# Patient Record
Sex: Male | Born: 1995 | Race: White | Hispanic: No | Marital: Single | Smoking: Current every day smoker
Health system: Southern US, Community
[De-identification: ages and names within clinical notes are randomized; demographics above are authoritative.]

## PROBLEM LIST (undated history)

## (undated) DIAGNOSIS — N319 Neuromuscular dysfunction of bladder, unspecified: Secondary | ICD-10-CM

## (undated) HISTORY — PX: MYRINGOTOMY WITH TUBE PLACEMENT: SHX5663

---
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
## 2018-05-26 ENCOUNTER — Other Ambulatory Visit: Payer: Self-pay

## 2018-05-26 ENCOUNTER — Encounter: Payer: Self-pay | Admitting: Emergency Medicine

## 2018-05-26 ENCOUNTER — Emergency Department
Admission: EM | Admit: 2018-05-26 | Discharge: 2018-05-26 | Disposition: A | Discharge status: Home / Self Care | Payer: Self-pay | Attending: Emergency Medicine | Admitting: Emergency Medicine

## 2018-05-26 DIAGNOSIS — F1210 Cannabis abuse, uncomplicated: Secondary | ICD-10-CM | POA: Insufficient documentation

## 2018-05-26 DIAGNOSIS — G4700 Insomnia, unspecified: Secondary | ICD-10-CM | POA: Insufficient documentation

## 2018-05-26 DIAGNOSIS — F17200 Nicotine dependence, unspecified, uncomplicated: Secondary | ICD-10-CM | POA: Insufficient documentation

## 2018-05-26 MED ORDER — ZOLPIDEM TARTRATE 10 MG PO TABS: 10.0000 mg | ORAL_TABLET | Freq: Every evening | ORAL | 0 refills | Status: DC | PRN

## 2018-05-26 NOTE — ED Triage Notes (Addendum)
Pt in with co inability to sleep, states in the last 48 hrs has only slept 3-4 hrs. States usually takes melatonin but not helping at this time. Pt denies any other complaints or concerns, states does need a work note.

## 2018-05-26 NOTE — ED Notes (Signed)
Pt reports insomnia with 3-4 hrs of sleep in the last 48 hrs. Reports having sleepless nights in the past but "never this bad". Pt denies significant stressors, endorses depression diagnosis, endorses marijuana use only.

## 2018-05-26 NOTE — ED Provider Notes (Signed)
[facility] Emergency Department Provider Note  ____________________________________________   First MD Initiated Contact with Patient 05/26/18 2116     (approximate)  I have reviewed the triage vital signs and the nursing notes.   HISTORY  Chief Complaint Insomnia    HPI Eric Flynn is a 22 y.o. male with no chronic medical history who presents for evaluation of insomnia.  He says that he has not been able to sleep at all for the last 2 or 3 days.  He has tried melatonin and Tylenol PM and nothing is worked.  He denies any recent changes in his schedule, no increase in caffeine usage, no other drugs other than occasional marijuana use, no new medications.  He has been under stress recently and found out that his sister killed herself, but he had this information several weeks ago and the insomnia only started within the last couple of days.  He is adamant that he does not use any stimulants because he says he grew up in a house with multiple people who use crack and he saw that he can do to you.  It is affecting his work and he is hoping that there is something that I can do to help with his insomnia.  He describes it is relatively acute in onset and severe.  Nothing helps nothing makes it worse.  He says he has suffered from depression in the past but he does not currently feel depressed and he has not had any thoughts of hurting himself or anyone else.  History reviewed. No pertinent past medical history.  There are no active problems to display for this patient.   History reviewed. No pertinent surgical history.  Prior to Admission medications   Medication Sig Start Date End Date Taking? Authorizing Provider  zolpidem (AMBIEN) 10 MG tablet Take 1 tablet (10 mg total) by mouth at bedtime as needed for sleep. 05/26/18 06/25/18  Loleta RoseForbach, Kaliana Albino, MD    Allergies Penicillins  History reviewed. No pertinent family history.  Social History Social History    Tobacco Use  . Smoking status: Current Every Day Smoker  . Smokeless tobacco: Never Used  Substance Use Topics  . Alcohol use: Not on file  . Drug use: Yes    Types: Marijuana    Comment: occasional    Review of Systems Constitutional: No fever/chills Cardiovascular: Denies chest pain. Respiratory: Denies shortness of breath. Gastrointestinal: No abdominal pain.  No nausea, no vomiting.  No diarrhea.  No constipation. Genitourinary: Negative for dysuria. Musculoskeletal: Negative for neck pain.  Negative for back pain. Integumentary: Negative for rash. Neurological: Negative for headaches, focal weakness or numbness. Psychiatric:Insomnia, no depression or suicidal ideation  ____________________________________________   PHYSICAL EXAM:  VITAL SIGNS: ED Triage Vitals  Enc Vitals Group     BP 05/26/18 1935 129/82     Pulse Rate 05/26/18 1935 (!) 112     Resp 05/26/18 1935 20     Temp 05/26/18 1935 98.2 F (36.8 C)     Temp Source 05/26/18 1935 Oral     SpO2 05/26/18 1935 100 %     Weight 05/26/18 1936 90.7 kg (200 lb)     Height 05/26/18 1936 1.803 m (5\' 11" )     Head Circumference --      Peak Flow --      Pain Score 05/26/18 2053 0     Pain Loc --      Pain Edu? --      Excl.  in GC? --     Constitutional: Alert and oriented. Well appearing and in no acute distress. Eyes: Conjunctivae are mildly injected bilaterally and the patient does look tired. Head: Atraumatic. Nose: No congestion/rhinnorhea. Neck: No stridor.  No meningeal signs.   Cardiovascular: Normal rate, regular rhythm. Good peripheral circulation. Grossly normal heart sounds. Respiratory: Normal respiratory effort.  No retractions. Lungs CTAB. Gastrointestinal: Soft and nontender. No distention.  Musculoskeletal: No lower extremity tenderness nor edema. No gross deformities of extremities. Neurologic:  Normal speech and language. No gross focal neurologic deficits are appreciated.  Skin:  Skin  is warm, dry and intact. No rash noted. Psychiatric: Mood and affect are normal. Speech and behavior are normal.  No flight of ideas, no manic or sub-manic behavior.  Insight and judgment seem appropriate.  No suicidal ideation or homicidal ideation.  ____________________________________________   LABS (all labs ordered are listed, but only abnormal results are displayed)  Labs Reviewed - No data to display ____________________________________________  EKG  None - EKG not ordered by ED physician ____________________________________________  RADIOLOGY  ED MD interpretation: No indication for imaging  Official radiology report(s): No results found.  ____________________________________________   PROCEDURES  Critical Care performed: No   Procedure(s) performed:   Procedures   ____________________________________________   INITIAL IMPRESSION / ASSESSMENT AND PLAN / ED COURSE  As part of my medical decision making, I reviewed the following data within the electronic MEDICAL RECORD NUMBER Nursing notes reviewed and incorporated and [place] Controlled Substance Database    Differential diagnosis includes primary insomnia, adjustment disorder, mood disorder, depression, manic depression or bipolar disorder, substance abuse, medication side effect.  However the patient seems to be fairly straightforward, no concerning psychiatric signs or symptoms, no concerns for acute medical issue.  Additional testing will not be helpful tonight.  No hits in the controlled substance database.  I would not be comfortable prescribing him benzodiazepines, but I gave him prescription for 15 Ambien and I counseled him about how important it is that he follow-up at [facility] because I think they will be most helpful to him as he moves forward.  He understands and agrees with the plan.  I gave my usual customary recommendations regarding how to deal with insomnia and he agrees to try.      ____________________________________________  FINAL CLINICAL IMPRESSION(S) / ED DIAGNOSES  Final diagnoses:  Insomnia, unspecified type     MEDICATIONS GIVEN DURING THIS VISIT:  Medications - No data to display   ED Discharge Orders        Ordered    zolpidem (AMBIEN) 10 MG tablet  At bedtime PRN     05/26/18 2158       Note:  This document was prepared using Dragon voice recognition software and may include unintentional dictation errors.    Loleta Rose, MD 05/26/18 2210

## 2019-01-25 ENCOUNTER — Emergency Department
Admission: EM | Admit: 2019-01-25 | Discharge: 2019-01-25 | Disposition: A | Discharge status: Home / Self Care | Payer: Self-pay | Attending: Emergency Medicine | Admitting: Emergency Medicine

## 2019-01-25 ENCOUNTER — Encounter: Payer: Self-pay | Admitting: Emergency Medicine

## 2019-01-25 ENCOUNTER — Other Ambulatory Visit: Payer: Self-pay

## 2019-01-25 DIAGNOSIS — R6889 Other general symptoms and signs: Secondary | ICD-10-CM

## 2019-01-25 DIAGNOSIS — F17210 Nicotine dependence, cigarettes, uncomplicated: Secondary | ICD-10-CM | POA: Insufficient documentation

## 2019-01-25 DIAGNOSIS — J111 Influenza due to unidentified influenza virus with other respiratory manifestations: Secondary | ICD-10-CM | POA: Insufficient documentation

## 2019-01-25 HISTORY — DX: Neuromuscular dysfunction of bladder, unspecified: N31.9

## 2019-01-25 MED ORDER — OSELTAMIVIR PHOSPHATE 75 MG PO CAPS: 75.0000 mg | ORAL_CAPSULE | Freq: Two times a day (BID) | ORAL | 0 refills | Status: AC

## 2019-01-25 MED ORDER — IBUPROFEN 600 MG PO TABS: 600.0000 mg | ORAL_TABLET | Freq: Three times a day (TID) | ORAL | 0 refills | Status: AC | PRN

## 2019-01-25 MED ORDER — ONDANSETRON 8 MG PO TBDP
8.0000 mg | ORAL_TABLET | Freq: Once | ORAL | Status: AC
  Administered 2019-01-25: 8 mg via ORAL
  Filled 2019-01-25: qty 1

## 2019-01-25 MED ORDER — PROMETHAZINE-DM 6.25-15 MG/5ML PO SYRP: 5.0000 mL | ORAL_SOLUTION | Freq: Four times a day (QID) | ORAL | 0 refills | Status: AC | PRN

## 2019-01-25 NOTE — ED Triage Notes (Addendum)
Pt says since early afternoon Saturday he's been having headache, cough, vomiting, abd pain, sore throat; productive cough of dark green; pt says he has not checked his temp but his grandmother, "who has years and years of experience", checked him and said he had a fever; pt ambulatory with steady gait; talking in complete coherent sentences;

## 2019-01-25 NOTE — ED Provider Notes (Signed)
[facility] Emergency Department Provider Note   ____________________________________________   First MD Initiated Contact with Patient 01/25/19 [phone]     (approximate)  I have reviewed the triage vital signs and the nursing notes.   HISTORY  Chief Complaint No chief complaint on file.    HPI Eric Flynn is a 23 y.o. male patient presents with 2 days of headache, cough, vomiting, sore throat, and productive cough.  Patient state unsure of fever.  Patient state has not taken flu shot for this season but especially exposed by family members.  Patient rates his pain/ discomfort as a 3/10.  Patient describes his pain/discomfort as "aching".  No palliative measure for complaint.   Patient sent home from job secondary to complaint.   Past Medical History:  Diagnosis Date  . Neurogenic bladder     There are no active problems to display for this patient.   Past Surgical History:  Procedure Laterality Date  . MYRINGOTOMY WITH TUBE PLACEMENT      Prior to Admission medications   Medication Sig Start Date End Date Taking? Authorizing Provider  ibuprofen (ADVIL,MOTRIN) 600 MG tablet Take 1 tablet (600 mg total) by mouth every 8 (eight) hours as needed. 01/25/19   Joni Reining, PA-C  oseltamivir (TAMIFLU) 75 MG capsule Take 1 capsule (75 mg total) by mouth 2 (two) times daily for 5 days. 01/25/19 01/30/19  Joni Reining, PA-C  promethazine-dextromethorphan (PROMETHAZINE-DM) 6.25-15 MG/5ML syrup Take 5 mLs by mouth 4 (four) times daily as needed for cough. 01/25/19   Joni Reining, PA-C    Allergies Penicillins  History reviewed. No pertinent family history.  Social History Social History   Tobacco Use  . Smoking status: Current Every Day Smoker    Types: Cigarettes  . Smokeless tobacco: Never Used  Substance Use Topics  . Alcohol use: Yes    Comment: very rarely  . Drug use: Yes    Types: Marijuana    Comment: smoked this am    Review of  Systems  Constitutional: No fever/chills Eyes: No visual changes. ENT: Nasal congestion and sore throat.  Cardiovascular: Denies chest pain. Respiratory: Denies shortness of breath.  Nonproductive cough. Gastrointestinal: Mild abdominal pain.  Nausea and vomiting.  No diarrhea.  No constipation. Genitourinary: Negative for dysuria. Musculoskeletal: Negative for back pain. Skin: Negative for rash. Neurological: Negative for headaches, focal weakness or numbness. Allergic/Immunilogical: Penicillin.  ____________________________________________   PHYSICAL EXAM:  VITAL SIGNS: ED Triage Vitals  Enc Vitals Group     BP 01/25/19 0658 122/77     Pulse Rate 01/25/19 0658 (!) 109     Resp 01/25/19 0658 16     Temp 01/25/19 0658 98.2 F (36.8 C)     Temp Source 01/25/19 0658 Oral     SpO2 01/25/19 0658 97 %     Weight 01/25/19 0659 185 lb (83.9 kg)     Height 01/25/19 0659 6' (1.829 m)     Head Circumference --      Peak Flow --      Pain Score 01/25/19 0658 3     Pain Loc --      Pain Edu? --      Excl. in GC? --    Constitutional: Alert and oriented. Well appearing and in no acute distress. Nose: Edematous nasal turbinates clear rhinorrhea.  Mouth/Throat: Mucous membranes are moist.  Oropharynx non-erythematous. Neck: No stridor.   Cardiovascular: Normal rate, regular rhythm. Grossly normal heart sounds.  Good peripheral circulation. Respiratory: Normal respiratory effort.  No retractions. Lungs CTAB. Gastrointestinal: Soft and nontender. No distention. No abdominal bruits. No CVA tenderness. Musculoskeletal: No lower extremity tenderness nor edema.  No joint effusions. Neurologic:  Normal speech and language. No gross focal neurologic deficits are appreciated. No gait instability. Skin:  Skin is warm, dry and intact. No rash noted. Psychiatric: Mood and affect are normal. Speech and behavior are normal.  ____________________________________________   LABS (all labs ordered  are listed, but only abnormal results are displayed)  Labs Reviewed - No data to display ____________________________________________  EKG   ____________________________________________  RADIOLOGY  ED MD interpretation:    Official radiology report(s): No results found.  ____________________________________________   PROCEDURES  Procedure(s) performed: None  Procedures  Critical Care performed: No  ____________________________________________   INITIAL IMPRESSION / ASSESSMENT AND PLAN / ED COURSE  As part of my medical decision making, I reviewed the following data within the electronic MEDICAL RECORD NUMBER     Multisystem complaint consistent with viral illness.  Patient given discharge care instruction advised take medication as directed.  Patient given a work note and advised [facility].      ____________________________________________   FINAL CLINICAL IMPRESSION(S) / ED DIAGNOSES  Final diagnoses:  Flu-like symptoms     ED Discharge Orders         Ordered    oseltamivir (TAMIFLU) 75 MG capsule  2 times daily     01/25/19 0724    promethazine-dextromethorphan (PROMETHAZINE-DM) 6.25-15 MG/5ML syrup  4 times daily PRN     01/25/19 0724    ibuprofen (ADVIL,MOTRIN) 600 MG tablet  Every 8 hours PRN     01/25/19 0724           Note:  This document was prepared using Dragon voice recognition software and may include unintentional dictation errors.    Joni Reining, PA-C 01/25/19 7829    Jene Every, MD 01/25/19 [phone]

## 2019-01-25 NOTE — ED Notes (Signed)
See triage note  Presents with body aches ,n/v and subjective fever  States he started feeling bad on Saturday and worse yesterday  Afebrile on arrival

## 2020-09-18 ENCOUNTER — Other Ambulatory Visit: Payer: Self-pay

## 2020-09-18 ENCOUNTER — Emergency Department: Payer: Self-pay

## 2020-09-18 ENCOUNTER — Encounter: Payer: Self-pay | Admitting: Emergency Medicine

## 2020-09-18 ENCOUNTER — Emergency Department
Admission: EM | Admit: 2020-09-18 | Discharge: 2020-09-18 | Disposition: A | Discharge status: Home / Self Care | Payer: Self-pay | Attending: Emergency Medicine | Admitting: Emergency Medicine

## 2020-09-18 DIAGNOSIS — Z5321 Procedure and treatment not carried out due to patient leaving prior to being seen by health care provider: Secondary | ICD-10-CM | POA: Insufficient documentation

## 2020-09-18 DIAGNOSIS — R0781 Pleurodynia: Secondary | ICD-10-CM | POA: Insufficient documentation

## 2020-09-18 DIAGNOSIS — W182XXA Fall in (into) shower or empty bathtub, initial encounter: Secondary | ICD-10-CM | POA: Insufficient documentation

## 2020-09-18 NOTE — ED Triage Notes (Signed)
Patient states that he slipped getting out of the tub yesterday morning. Patient with complaint of right rib pain.

## 2020-09-18 NOTE — ED Notes (Signed)
Pt called x'3, no response 

## 2023-01-17 ENCOUNTER — Other Ambulatory Visit: Payer: Self-pay

## 2023-01-17 ENCOUNTER — Emergency Department
Admission: EM | Admit: 2023-01-17 | Discharge: 2023-01-17 | Disposition: A | Discharge status: Home / Self Care | Payer: Self-pay | Attending: Emergency Medicine | Admitting: Emergency Medicine

## 2023-01-17 ENCOUNTER — Emergency Department: Payer: Self-pay

## 2023-01-17 DIAGNOSIS — Z20822 Contact with and (suspected) exposure to covid-19: Secondary | ICD-10-CM | POA: Insufficient documentation

## 2023-01-17 DIAGNOSIS — A084 Viral intestinal infection, unspecified: Secondary | ICD-10-CM | POA: Insufficient documentation

## 2023-01-17 LAB — URINALYSIS, ROUTINE W REFLEX MICROSCOPIC
Bilirubin Urine: NEGATIVE
Glucose, UA: NEGATIVE mg/dL
Hgb urine dipstick: NEGATIVE
Ketones, ur: 5 mg/dL — AB
Nitrite: NEGATIVE
Protein, ur: NEGATIVE mg/dL
Specific Gravity, Urine: 1.025 (ref 1.005–1.030)
pH: 5 (ref 5.0–8.0)

## 2023-01-17 LAB — CBC
HCT: 44.2 % (ref 39.0–52.0)
Hemoglobin: 15.2 g/dL (ref 13.0–17.0)
MCH: 31.9 pg (ref 26.0–34.0)
MCHC: 34.4 g/dL (ref 30.0–36.0)
MCV: 92.9 fL (ref 80.0–100.0)
Platelets: 213 10*3/uL (ref 150–400)
RBC: 4.76 MIL/uL (ref 4.22–5.81)
RDW: 11.7 % (ref 11.5–15.5)
WBC: 10.2 10*3/uL (ref 4.0–10.5)
nRBC: 0 % (ref 0.0–0.2)

## 2023-01-17 LAB — COMPREHENSIVE METABOLIC PANEL
ALT: 22 U/L (ref 0–44)
AST: 26 U/L (ref 15–41)
Albumin: 4.4 g/dL (ref 3.5–5.0)
Alkaline Phosphatase: 113 U/L (ref 38–126)
Anion gap: 8 (ref 5–15)
BUN: 11 mg/dL (ref 6–20)
CO2: 23 mmol/L (ref 22–32)
Calcium: 9 mg/dL (ref 8.9–10.3)
Chloride: 107 mmol/L (ref 98–111)
Creatinine, Ser: 0.83 mg/dL (ref 0.61–1.24)
GFR, Estimated: >60 mL/min (ref >60)
Glucose, Bld: 109 mg/dL — ABNORMAL HIGH (ref 70–99)
Potassium: 4 mmol/L (ref 3.5–5.1)
Sodium: 138 mmol/L (ref 135–145)
Total Bilirubin: 1.1 mg/dL (ref 0.3–1.2)
Total Protein: 7.5 g/dL (ref 6.5–8.1)

## 2023-01-17 LAB — RESP PANEL BY RT-PCR (RSV, FLU A&B, COVID)  RVPGX2
Influenza A by PCR: NEGATIVE
Influenza B by PCR: NEGATIVE
Resp Syncytial Virus by PCR: NEGATIVE
SARS Coronavirus 2 by RT PCR: NEGATIVE

## 2023-01-17 LAB — LIPASE, BLOOD: Lipase: 26 U/L (ref 11–51)

## 2023-01-17 MED ORDER — ONDANSETRON 4 MG PO TBDP: 4.0000 mg | ORAL_TABLET | Freq: Three times a day (TID) | ORAL | 0 refills | Status: AC | PRN

## 2023-01-17 MED ORDER — ONDANSETRON HCL 4 MG/2ML IJ SOLN
4.0000 mg | Freq: Once | INTRAMUSCULAR | Status: AC
  Administered 2023-01-17: 4 mg via INTRAVENOUS
  Filled 2023-01-17: qty 2

## 2023-01-17 MED ORDER — SODIUM CHLORIDE 0.9 % IV BOLUS
1000.0000 mL | Freq: Once | INTRAVENOUS | Status: AC
  Administered 2023-01-17: 1000 mL via INTRAVENOUS

## 2023-01-17 MED ORDER — IOHEXOL 350 MG/ML SOLN
75.0000 mL | Freq: Once | INTRAVENOUS | Status: AC | PRN
  Administered 2023-01-17: 75 mL via INTRAVENOUS

## 2023-01-17 NOTE — ED Triage Notes (Signed)
First nurse note: Pt to ED via POV from home. Pt ambulatory to triage. Pt reports N/V x4 days and this morning woke up feeling dizzy. Pt reports possible exposure to COVID.

## 2023-01-17 NOTE — Discharge Instructions (Signed)
Follow-up with your regular doctor if not improving in 3 days.  Return emergency department if worsening Use the Zofran for nausea as needed.  Imodium A-D if worsening diarrhea. Drink plenty of fluids

## 2023-01-17 NOTE — ED Provider Notes (Signed)
[facility] Provider Note    Event Date/Time   First MD Initiated Contact with Patient 01/17/23 [phone]     (approximate)   History   Emesis   HPI  Eric Flynn is a 27 y.o. male with no significant past medical history other than a neurogenic bladder presents emergency department with nausea/vomiting for 4 days.  Some diarrhea.  Woke up this morning and started feeling dizzy.  Thinks he may have been exposed to [place].  Does have pain in the right lower quadrant.      Physical Exam   Triage Vital Signs: ED Triage Vitals  Enc Vitals Group     BP 01/17/23 0926 119/72     Pulse Rate 01/17/23 0926 88     Resp 01/17/23 0926 18     Temp 01/17/23 0926 97.9 F (36.6 C)     Temp Source 01/17/23 0926 Oral     SpO2 01/17/23 0926 96 %     Weight 01/17/23 0948 190 lb 0.6 oz (86.2 kg)     Height 01/17/23 0948 6\' 1"  (1.854 m)     Head Circumference --      Peak Flow --      Pain Score 01/17/23 0918 1     Pain Loc --      Pain Edu? --      Excl. in [place]? --     Most recent vital signs: Vitals:   01/17/23 0926  BP: 119/72  Pulse: 88  Resp: 18  Temp: 97.9 F (36.6 C)  SpO2: 96%     General: Awake, no distress.   CV:  Good peripheral perfusion. regular rate and  rhythm Resp:  Normal effort. Lungs cta Abd:  No distention.  Abdomen tender in the right lower quadrant Other:      ED Results / Procedures / Treatments   Labs (all labs ordered are listed, but only abnormal results are displayed) Labs Reviewed  COMPREHENSIVE METABOLIC PANEL - Abnormal; Notable for the following components:      Result Value   Glucose, Bld 109 (*)    All other components within normal limits  URINALYSIS, ROUTINE W REFLEX MICROSCOPIC - Abnormal; Notable for the following components:   Color, Urine YELLOW (*)    APPearance HAZY (*)    Ketones, ur 5 (*)    Leukocytes,Ua TRACE (*)    Bacteria, UA FEW (*)    All other components within normal limits  RESP PANEL BY  RT-PCR (RSV, FLU A&B, COVID)  RVPGX2  LIPASE, BLOOD  CBC     EKG     RADIOLOGY CT abdomen pelvis with IV contrast    PROCEDURES:   Procedures   MEDICATIONS ORDERED IN ED: Medications  sodium chloride 0.9 % bolus 1,000 mL (0 mLs Intravenous Stopped 01/17/23 1053)  ondansetron (ZOFRAN) injection 4 mg (4 mg Intravenous Given 01/17/23 1007)  iohexol (OMNIPAQUE) 350 MG/ML injection 75 mL (75 mLs Intravenous Contrast Given 01/17/23 1109)     IMPRESSION / MDM / ASSESSMENT AND PLAN / ED COURSE  I reviewed the triage vital signs and the nursing notes.                              Differential diagnosis includes, but is not limited to, gastroenteritis, viral illness, COVID, influenza, acute appendicitis  Patient's presentation is most consistent with acute complicated illness / injury requiring diagnostic workup.   Patient's labs are  reassuring  Due to the right lower quadrant pain will assess for acute appendicitis with CT  Patient was given 1 L normal saline, 4 mg Zofran IV.  CT abdomen/pelvis with IV contrast to assess for acute appendicitis was independently reviewed and interpreted by me as being negative for any acute abnormality.  I did discuss this finding with patient.  Patient peers to be feeling much better after the fluids and Zofran.  I told him that his CT is negative and no need for admission at this time.  Patient is in agreement with this treatment plan.  He is to follow-up with his regular doctor if not improving 3 to 4 days.  Return emergency department worsening.  He was discharged stable condition in the care of his wife.     FINAL CLINICAL IMPRESSION(S) / ED DIAGNOSES   Final diagnoses:  Viral gastroenteritis     Rx / DC Orders   ED Discharge Orders          Ordered    ondansetron (ZOFRAN-ODT) 4 MG disintegrating tablet  Every 8 hours PRN        01/17/23 1148             Note:  This document was prepared using Dragon voice recognition  software and may include unintentional dictation errors.    Versie Starks, PA-C 01/17/23 1307    Carrie Mew, MD 01/17/23 [phone]
